# Patient Record
Sex: Female | Born: 1995 | Race: White | Hispanic: No | Marital: Single | Smoking: Never smoker
Health system: Southern US, Community
[De-identification: ages and names within clinical notes are randomized; demographics above are authoritative.]

---
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
## 2017-01-18 NOTE — Assessment & Plan Note (Signed)
Symptoms are most consistent today with a medial meniscal tear based on MSK ultrasound findings as below.  Ultimately injection today was performed as outlined below.  Once she has good improvement in her symptoms she will be able to return to activities as tolerated but I like for her to take the next several days off.  +++++++++++++++++++++++++++++++++++++++++++++++++++++++++++++++++++++++++++++ PROCEDURE NOTE -  ULTRASOUND GUIDEDINJECTION: Left knee para meniscal cyst injection Images were obtained and interpreted by myself, Gaspar BiddingMichael Rigby, DO  Images have been saved and stored to PACS system. Images obtained on: GE S7 Ultrasound machine  ULTRASOUND FINDINGS:  Medial joint line has moderate degree of pericystic changes within the medial meniscus.  There is no overt swelling but there is a large extruded cystic area of the meniscus.  Lateral joint line is normal appearing.  Quadricep tendon is intact.  She has only a small supraphysiologic effusion.  DESCRIPTION OF PROCEDURE:  The patient's clinical condition is marked by substantial pain and/or significant functional disability. Other conservative therapy has not provided relief, is contraindicated, or not appropriate. There is a reasonable likelihood that injection will significantly improve the patient's pain and/or functional impairment. After discussing the risks, benefits and expected outcomes of the injection and all questions were reviewed and answered, the patient wished to undergo the above named procedure. Verbal consent was obtained. The ultrasound was used to identify the target structure and adjacent neurovascular structures. The skin was then prepped in sterile fashion and the target structure was injected under direct visualization using sterile technique as below: PREP: Alcohol, Ethel Chloride APPROACH: Medial,, sterile stopcock Technique, 22g 1.5" needle INJECTATE: 3 cc 1% lidocaine, 2 cc 0.5% marcaine, 2cc 40mg   DepoMedrol ASPIRATE: N/A DRESSING: Band-Aid  Post procedural instructions including recommending icing and warning signs for infection were reviewed. This procedure was well tolerated and there were no complications.   IMPRESSION: Succesful US Guided Injection    Left knee

## 2017-01-21 ENCOUNTER — Encounter: Payer: Self-pay | Admitting: Sports Medicine

## 2017-02-08 ENCOUNTER — Encounter: Payer: Self-pay | Admitting: Sports Medicine

## 2017-04-04 ENCOUNTER — Encounter: Payer: Self-pay | Admitting: Sports Medicine

## 2017-04-14 ENCOUNTER — Ambulatory Visit (INDEPENDENT_AMBULATORY_CARE_PROVIDER_SITE_OTHER): Payer: No Typology Code available for payment source | Admitting: Sports Medicine

## 2017-04-14 ENCOUNTER — Encounter: Payer: Self-pay | Admitting: Sports Medicine

## 2017-04-14 ENCOUNTER — Ambulatory Visit: Payer: Self-pay

## 2017-04-14 VITALS — BP 102/62 | HR 86 | Ht 68.01 in | Wt 129.0 lb

## 2017-04-14 DIAGNOSIS — M79604 Pain in right leg: Secondary | ICD-10-CM | POA: Diagnosis not present

## 2017-04-14 DIAGNOSIS — R5383 Other fatigue: Secondary | ICD-10-CM | POA: Diagnosis not present

## 2017-04-14 DIAGNOSIS — M79605 Pain in left leg: Secondary | ICD-10-CM | POA: Diagnosis not present

## 2017-04-14 LAB — COMPREHENSIVE METABOLIC PANEL
ALT: 14 U/L (ref 0–35)
AST: 22 U/L (ref 0–37)
Albumin: 4.9 g/dL (ref 3.5–5.2)
Alkaline Phosphatase: 78 U/L (ref 47–119)
BILIRUBIN TOTAL: 0.6 mg/dL (ref 0.3–1.2)
BUN: 15 mg/dL (ref 6–23)
CO2: 29 meq/L (ref 19–32)
CREATININE: 0.77 mg/dL (ref 0.40–1.20)
Calcium: 9.9 mg/dL (ref 8.4–10.5)
Chloride: 104 mEq/L (ref 96–112)
GFR: 102.73 mL/min (ref >60.00)
GLUCOSE: 89 mg/dL (ref 70–99)
Potassium: 4.2 mEq/L (ref 3.5–5.1)
Sodium: 139 mEq/L (ref 135–145)
Total Protein: 6.9 g/dL (ref 6.0–8.3)

## 2017-04-14 LAB — CBC WITH DIFFERENTIAL/PLATELET
BASOS PCT: 0.7 % (ref 0.0–3.0)
Basophils Absolute: 0 10*3/uL (ref 0.0–0.1)
Eosinophils Absolute: 0.2 10*3/uL (ref 0.0–0.7)
Eosinophils Relative: 2.7 % (ref 0.0–5.0)
HCT: 41.4 % (ref 36.0–49.0)
HEMOGLOBIN: 13.9 g/dL (ref 12.0–16.0)
Lymphocytes Relative: 37.6 % (ref 24.0–48.0)
Lymphs Abs: 2.2 10*3/uL (ref 0.7–4.0)
MCHC: 33.5 g/dL (ref 31.0–37.0)
MCV: 96.3 fl (ref 78.0–98.0)
MONO ABS: 0.4 10*3/uL (ref 0.1–1.0)
Monocytes Relative: 7.1 % (ref 3.0–12.0)
Neutro Abs: 3 10*3/uL (ref 1.4–7.7)
Neutrophils Relative %: 51.9 % (ref 43.0–71.0)
Platelets: 173 10*3/uL (ref 150.0–575.0)
RBC: 4.3 Mil/uL (ref 3.80–5.70)
RDW: 12 % (ref 11.4–15.5)
WBC: 5.9 10*3/uL (ref 4.5–13.5)

## 2017-04-14 LAB — VITAMIN D 25 HYDROXY (VIT D DEFICIENCY, FRACTURES): VITD: 62.32 ng/mL (ref 30.00–100.00)

## 2017-04-14 LAB — FERRITIN: FERRITIN: 46.2 ng/mL (ref 10.0–291.0)

## 2017-04-14 NOTE — Progress Notes (Signed)
OFFICE VISIT NOTE [name] [name] Sports Medicine [facility] at [facility] [phone]  Maleaha Hughett - 21 y.o. female MRN [id number]  Date of birth: 21-Apr-1996  Visit Date: 04/14/2017  PCP: Patient, No Pcp Per   Referred by: No ref. provider found  Orlie Dakin, CMA acting as scribe for Dr. Berline Chough.  SUBJECTIVE:   Chief Complaint  Patient presents with  . bilateral leg pain   HPI: As below and per problem based documentation when appropriate.  Rolena is an established patient presenting today with complaint of bilateral leg pain. She has been seen for left leg pain in the past. She says that the pain in her left leg is just the same as when she was here before and had the stress reaction. She is now having the same sx in her right leg. The twinges of pain started last semester but have gotten worse. Being on her feet for a while after she runs makes the pain worse. She doesn't have pain while running, especially if she wears the compression band. The pain normally lasts until she sits down. She has iced both legs and taken Ibuprofen with some relief. Pain is rated about 6/10 at its worst but 1/10 when its mild. Pain is described as sharp initially but once it started swelling its an aching sensation. She says that her legs feel swollen but don't look swollen. She denies pain in the groin. She does have some tightness in both hips. She denies knee pain and ankle pain. She has had tightness in both calves x 2 days.      Review of Systems  Constitutional: Negative for chills, fever and malaise/fatigue.  HENT: Negative.   Respiratory: Negative for shortness of breath and wheezing.   Cardiovascular: Positive for leg swelling. Negative for chest pain and palpitations.  Gastrointestinal: Negative.   Genitourinary: Negative.   Musculoskeletal: Positive for myalgias.  Skin: Negative.   Neurological: Negative for dizziness, tingling, weakness and headaches.    Endo/Heme/Allergies: Does not bruise/bleed easily.  Psychiatric/Behavioral: Negative.     Otherwise per HPI.  HISTORY & PERTINENT PRIOR DATA:  No specialty comments available. She reports that she has never smoked. She has never used smokeless tobacco. No results for input(s): HGBA1C, LABURIC in the last 8760 hours. Medications & Allergies reviewed per EMR Patient Active Problem List   Diagnosis Date Noted  . Right leg pain 04/14/2017  . Quadriceps strain, left, initial encounter 12/19/2016  . Left leg pain 12/11/2016   No past medical history on file. Family History  Problem Relation Age of Onset  . Cancer Father    No past surgical history on file. Social History   Occupational History  . Not on file.   Social History Main Topics  . Smoking status: Never Smoker  . Smokeless tobacco: Never Used  . Alcohol use No  . Drug use: Unknown  . Sexual activity: Yes    Birth control/ protection: IUD    OBJECTIVE:  VS:  HT:5' 8.01" (172.7 cm)   WT:129 lb (58.5 kg)  BMI:19.6    BP:102/62  HR:86bpm  TEMP: ( )  RESP:97 % EXAM: Findings:  WDWN, NAD, Non-toxic appearing Alert & appropriately interactive Not depressed or anxious appearing No increased work of breathing. Pupils are equal. EOM intact without nystagmus No clubbing or cyanosis of the extremities appreciated No significant rashes/lesions/ulcerations overlying the examined area. DP & PT pulses 2+/4.  No significant pretibial edema. Sensation intact to light  touch in lower extremities.  Bilateral legs:  Right leg is overall well aligned.  She is very thin but muscular.  She does have pain with fulcrum testing on the right, not on the left but this is mild.  Pain localizes to the distal femur.  Some pain with palpation of the vastus medialis on the right is not present on the left but this is mild.  Good internal/external rotation bilateral hips, hip abduction strength is 5-/5 with T FL predominance.  Her bilateral  feet have a moderate arch with early breakdown of the transverse arch with early bunion formation  Gait analysis on the treadmill does reveal overall neutral gait with forefoot strike with questionable amount of over striding but this is mild.  She does have an anterior pelvic tilt.      [place] Limited Joint Space Structures Low Bilat(no Linked Charges)  Result Date: 04/14/2017 Andrena MewsRigby, Marguetta Windish D, DO     04/16/2017 11:38 PM LIMITED MSK ULTRASOUND OF Right Leg Images were obtained and interpreted by myself, Gaspar BiddingMichael Kemyra August, DO Images have been saved and stored to PACS system. Images obtained on: GE S7 Ultrasound machine FINDINGS: Multiple views longitudinal and transverse planes were obtained of the middle third of the femur as well as surrounding muscle skeletal structures.  There is no obvious abnormality at the femoral cortex although there is a questionable amount of periosteal reaction however this seems symmetric to the other side.  Additionally there is clinically uncertain periosteal vessel that is in the area of discomfort with sono palpation with mild. The quadricep musculature does appear to be slightly  heterogeneous suggesting possible glycogen depletion IMPRESSION: 1. Nondiagnostic MSK ultrasound.  2. Question of potential early stress reaction of the femoral shaft. 3. Question glycogen depletion of the quadricep musculature   ASSESSMENT & PLAN:     ICD-10-CM   1. Bilateral leg pain M79.604 ibuprofen (ADVIL,MOTRIN) 200 MG tablet   M79.605 CBC with Differential/Platelet    Comprehensive metabolic panel  2. Right leg pain M79.604 US LIMITED JOINT SPACE STRUCTURES LOW BILAT(NO LINKED CHARGES)  3. Fatigue, unspecified type R53.83 CBC with Differential/Platelet    Comprehensive metabolic panel    VITAMIN D 25 Hydroxy (Vit-D Deficiency, Fractures)    Iron and TIBC    Ferritin  ================================================================= Right leg pain Patient is recently had a stress  reaction of the left femur with concerns for potential early stress fracture at this time on the right.  Will check for metabolic causes and have her take the next 2 weeks significantly easier she has not had a good rest.  Since resuming training.  I would like to get her fit [facility] and she should not run outside until the cement done.  Okay to cross train as tolerated.  If any pain she knows that she should not continue.  She should follow-up in 2 weeks   >50% of this 25 minute visit spent in direct patient counseling and/or coordination of care.  Discussion was focused on education regarding the in discussing the pathoetiology and anticipated clinical course of the above condition.  Importance of nutrition counseling and appropriate recovery discussed.  I do think there is a component of overuse however she has not been training excessively and I believe we need to look for a biomechanical and nutritional causes. ================================================================= Patient Instructions  I believe you have an early stress reaction of your right leg.  We need to ensure that you are  recovering appropriately and her ensuring that your lab work looks okay today.    Be sure you are getting 30g of carbohydrate and 10g of protein within 30 minutes of your runs; please follow-up with the nutritionist as well.  Decrease your running to no more than 3 miles, 4 days per week for the next week.  As long as you continue to improve we will be able to bump up your mileage at that point.    We will also set you up with a set of cushioned insoles to see if this can help reduce some of the impact that is contributing to your leg pain. Joselyn Glassman will be able to do these for you.   Also check out the YouTube Video from Dr. Myles Lipps.  I would like to see you try performing this 5-6 days per week.   A good intro video is: "Independence from Pain  7-minute Video" - [email or web address]  His more advanced video is: "Powerful Posture and Pain Relief: 12 minutes of Foundation Training" - https://youtu.be/[email or web address]   Do not try to attempt this entire video when first beginning.    Try breaking of each exercise that he goes into shorter segments.  Otherwise if they perform an exercise for 45 seconds, start with 15 seconds and rest and then resume with a begin the new activity.  Work your way up to doing this 12 minute video and I expect to see significant improvements in your pain.  ================================================================= Future Appointments Date Time Provider Department Center  04/28/2017 11:15 AM Andrena Mews, DO [facility] None    Follow-up: Return in about 2 weeks (around 04/28/2017).   CMA/ATC served as Neurosurgeon during this visit. History, Physical, and Plan performed by medical provider. Documentation and orders reviewed and attested to.      Gaspar Bidding, DO    [facility] Sports Medicine Physician

## 2017-04-14 NOTE — Patient Instructions (Signed)
I believe you have an early stress reaction of your right leg.  We need to ensure that you are recovering appropriately and her ensuring that your lab work looks okay today.    Be sure you are getting 30g of carbohydrate and 10g of protein within 30 minutes of your runs; please follow-up with the nutritionist as well.  Decrease your running to no more than 3 miles, 4 days per week for the next week.  As long as you continue to improve we will be able to bump up your mileage at that point.    We will also set you up with a set of cushioned insoles to see if this can help reduce some of the impact that is contributing to your leg pain. Joselyn Glassmanyler will be able to do these for you.   Also check out the YouTube Video from Dr. Myles LippsEric Goodman.  I would like to see you try performing this 5-6 days per week.   A good intro video is: "Independence from Pain 7-minute Video" - [email or web address]/[email or web address]?[email or web address]  His more advanced video is: "Powerful Posture and Pain Relief: 12 minutes of Foundation Training" - https://youtu.be/[email or web address]   Do not try to attempt this entire video when first beginning.    Try breaking of each exercise that he goes into shorter segments.  Otherwise if they perform an exercise for 45 seconds, start with 15 seconds and rest and then resume with a begin the new activity.  Work your way up to doing this 12 minute video and I expect to see significant improvements in your pain.

## 2017-04-15 LAB — IRON AND TIBC
%SAT: 33 % (ref 8–45)
Iron: 113 ug/dL (ref 27–164)
TIBC: 341 ug/dL (ref 271–448)
UIBC: 228 ug/dL

## 2017-04-16 NOTE — Assessment & Plan Note (Addendum)
Patient is recently had a stress reaction of the left femur with concerns for potential early stress fracture at this time on the right.  Will check for metabolic causes and have her take the next 2 weeks significantly easier she has not had a good rest.  Since resuming training.  I would like to get her fit for custom cushion orthotics through the [facility] and she should not run outside until the cement done.  Okay to cross train as tolerated.  If any pain she knows that she should not continue.  She should follow-up in 2 weeks   >50% of this 25 minute visit spent in direct patient counseling and/or coordination of care.  Discussion was focused on education regarding the in discussing the pathoetiology and anticipated clinical course of the above condition.  Importance of nutrition counseling and appropriate recovery discussed.  I do think there is a component of overuse however she has not been training excessively and I believe we need to look for a biomechanical and nutritional causes.

## 2017-04-16 NOTE — Procedures (Signed)
LIMITED MSK ULTRASOUND OF Right Leg Images were obtained and interpreted by myself, Gaspar BiddingMichael Rigby, DO  Images have been saved and stored to PACS system. Images obtained on: GE S7 Ultrasound machine  FINDINGS:  Multiple views longitudinal and transverse planes were obtained of the middle third of the femur as well as surrounding muscle skeletal structures.  There is no obvious abnormality at the femoral cortex although there is a questionable amount of periosteal reaction however this seems symmetric to the other side.  Additionally there is clinically uncertain periosteal vessel that is in the area of discomfort with sono palpation with mild. The quadricep musculature does appear to be slightly  heterogeneous suggesting possible glycogen depletion IMPRESSION:  1. Nondiagnostic MSK ultrasound.   2. Question of potential early stress reaction of the femoral shaft. 3. Question glycogen depletion of the quadricep musculature

## 2017-04-28 ENCOUNTER — Encounter: Payer: Self-pay | Admitting: Sports Medicine

## 2017-04-28 ENCOUNTER — Ambulatory Visit (INDEPENDENT_AMBULATORY_CARE_PROVIDER_SITE_OTHER): Payer: No Typology Code available for payment source | Admitting: Sports Medicine

## 2017-04-28 VITALS — BP 100/62 | HR 78 | Ht 68.0 in | Wt 129.6 lb

## 2017-04-28 DIAGNOSIS — M23004 Cystic meniscus, unspecified medial meniscus, left knee: Secondary | ICD-10-CM | POA: Diagnosis not present

## 2017-04-28 DIAGNOSIS — M25562 Pain in left knee: Secondary | ICD-10-CM | POA: Diagnosis not present

## 2017-04-28 DIAGNOSIS — M79604 Pain in right leg: Secondary | ICD-10-CM

## 2017-04-28 DIAGNOSIS — M79605 Pain in left leg: Secondary | ICD-10-CM

## 2017-04-28 MED ORDER — DICLOFENAC SODIUM 2 % TD SOLN: 1.0000 "application " | Freq: Two times a day (BID) | TRANSDERMAL | 0 refills | Status: AC

## 2017-04-28 MED ORDER — DICLOFENAC SODIUM 2 % TD SOLN: 1.0000 "application " | Freq: Two times a day (BID) | TRANSDERMAL | 2 refills | Status: DC

## 2017-04-28 NOTE — Patient Instructions (Signed)
I am okay with you returning to 20 miles per week at this time.  It is okay to start interspersing some speed work.  Do not increase her mileage above 30 miles over the next 3-4 weeks.  No more than 4-5 days of running per week, crosstraining on the other side.  If you are able to accommodate this in the next 2-3 weeks I am okay with you resuming racing.  Work with Joselyn Glassman using E-stem, cupping, and massage on the quads  Work on one leg at lifting activities in the gym including one legged squats.  We need to work on getting your left quad as strong as the right.  Be sure you follow the nutrition plan as outlined by the nutritionist.  He have to keep your energy stores full to be able to prevent muscle and bone stress.  You can use the Pennsaid on your left knee as needed up to 2 times per day

## 2017-04-28 NOTE — Progress Notes (Signed)
OFFICE VISIT NOTE Allison Burns. Allison Burns Sports Medicine [facility] at [facility] [phone]  Allison Burns - 21 y.o. female MRN [id number]  Date of birth: [birth year]  Visit Date: 04/28/2017  PCP: Patient, No Pcp Per   Referred by: No ref. provider found  Allison Burns, CMA acting as scribe for Dr. Berline Burns.  SUBJECTIVE:   Chief Complaint  Patient presents with  . Follow-up    bilateral quad pain   HPI: As below and per problem based documentation when appropriate.  Pt here for follow up on bilateral quad pain. Pain is better in right leg, but starting to have some pain in left lower quad/knee again.   Pt says pain in left quad/knee is very similar to how it felt back in April. Pain is medial on left knee. Pt had a cortisone injection in April that provided relief. Pain is not constant, but pt describes pain as a dull ache when she has it. No swelling. Pt says she has pain about three days a week. Rates pain as a 1 out of 10.  Pt is running every other day and biking every day.   Pt is not using any ice or heat on knee. Pt does not take NSAIDs often, but does get relief when she takes them.      Review of Systems  Constitutional: Negative for chills and fever.  Respiratory: Negative for shortness of breath.   Cardiovascular: Negative for chest pain.  Musculoskeletal: Positive for joint pain. Negative for falls.  Endo/Heme/Allergies: Does not bruise/bleed easily.    Otherwise per HPI.  HISTORY & PERTINENT PRIOR DATA:  No specialty comments available. She reports that she has never smoked. She has never used smokeless tobacco. No results for input(s): HGBA1C, LABURIC in the last 8760 hours. Medications & Allergies reviewed per EMR Patient Active Problem List   Diagnosis Date Noted  . Cyst of medial meniscus of left knee 04/28/2017  . Right leg pain 04/14/2017  . Quadriceps strain, left, initial encounter 12/19/2016  . Left leg pain 12/11/2016   No  past medical history on file. Family History  Problem Relation Age of Onset  . Cancer Father    No past surgical history on file. Social History   Occupational History  . Not on file.   Social History Main Topics  . Smoking status: Never Smoker  . Smokeless tobacco: Never Used  . Alcohol use No  . Drug use: Unknown  . Sexual activity: Yes    Birth control/ protection: IUD    OBJECTIVE:  VS:  HT:5\' 8"  (172.7 cm)   WT:129 lb 9.6 oz (58.8 kg)  BMI:19.71    BP:100/62  HR:78bpm  TEMP: ( )  RESP:98 % EXAM: Findings:  Adult female.  No acute distress.  Alert and appropriate.  Bilateral lower extremities overall well aligned.  Quite muscular but thin.  She has a small amount of pain with fulcrum testing but no pain with jump testing.  Pain does not localize.     [place] Limited Joint Space Structures Low Bilat(no Linked Charges)  Result Date: 04/14/2017 Allison Mews, DO     04/16/2017 11:38 PM LIMITED MSK ULTRASOUND OF Right Leg Images were obtained and interpreted by myself, Allison Bidding, DO Images have been saved and stored to PACS system. Images obtained on: GE S7 Ultrasound machine FINDINGS: Multiple views longitudinal and transverse planes were obtained of the middle third of the femur as well as surrounding muscle skeletal  structures.  There is no obvious abnormality at the femoral cortex although there is a questionable amount of periosteal reaction however this seems symmetric to the other side.  Additionally there is clinically uncertain periosteal vessel that is in the area of discomfort with sono palpation with mild. The quadricep musculature does appear to be slightly  heterogeneous suggesting possible glycogen depletion IMPRESSION: 1. Nondiagnostic MSK ultrasound.  2. Question of potential early stress reaction of the femoral shaft. 3. Question glycogen depletion of the quadricep musculature   ASSESSMENT & PLAN:     ICD-10-CM   1. Left knee pain, unspecified chronicity  M25.562   2. Left leg pain M79.605   3. Bilateral leg pain M79.604    M79.605   4. Cyst of medial meniscus of left knee M23.004   ================================================================= Left leg pain See AVS for further discussion on plan.>50% of this 25 minute visit spent in direct patient counseling and/or coordination of care.  Discussion was focused on education regarding the in discussing the pathoetiology and anticipated clinical course of the above condition.  Ultimately slow return to running is discussed and I actually recommend that she avoid running more than 5 days per week.  Okay to cross train on the other days.  From a symptom standpoint as long as she is not having any worsening symptoms I am okay with her working towards running upwards to 35-40 miles per week but cautioned her in doing so.  I do think that some of the inability to train at this level is secondary to her nutrition intake and she will meet with the nutritionist at [facility].  She also should be wearing the custom cushion insoles in any of her shoes other than spikes.  If any lack of improvement will plan for further rest but no indication for further advanced imaging at this time.  Cyst of medial meniscus of left knee Small amount of discomfort returning of the medial meniscus but suspect this is likely secondary to under recovery/undernutrition.  We will have her use Pennsaid and if any lack of improvement can consider further injection but would like to avoid this. ================================================================= Patient Instructions  I am okay with you returning to 20 miles per week at this time.  It is okay to start interspersing some speed work.  Do not increase her mileage above 30 miles over the next 3-4 weeks.  No more than 4-5 days of running per week, crosstraining on the other side.  If you are able to accommodate this in the next 2-3 weeks I am okay with you resuming racing.  Work with  Allison Burns using E-stem, cupping, and massage on the quads  Work on one leg at lifting activities in the gym including one legged squats.  We need to work on getting your left quad as strong as the right.  Be sure you follow the nutrition plan as outlined by the nutritionist.  He have to keep your energy stores full to be able to prevent muscle and bone stress.  You can use the Pennsaid on your left knee as needed up to 2 times per day ================================================================= No future appointments.  Follow-up: Return if symptoms worsen or fail to improve, for We will plan to check again in the athletic training room in several weeks.   CMA/ATC served as Neurosurgeon during this visit. History, Physical, and Plan performed by medical provider. Documentation and orders reviewed and attested to.      Allison Bidding, DO    [facility]  Sports Medicine Physician

## 2017-05-05 NOTE — Assessment & Plan Note (Signed)
Small amount of discomfort returning of the medial meniscus but suspect this is likely secondary to under recovery/undernutrition.  We will have her use Pennsaid and if any lack of improvement can consider further injection but would like to avoid this.

## 2017-05-05 NOTE — Assessment & Plan Note (Signed)
See AVS for further discussion on plan.>50% of this 25 minute visit spent in direct patient counseling and/or coordination of care.  Discussion was focused on education regarding the in discussing the pathoetiology and anticipated clinical course of the above condition.  Ultimately slow return to running is discussed and I actually recommend that she avoid running more than 5 days per week.  Okay to cross train on the other days.  From a symptom standpoint as long as she is not having any worsening symptoms I am okay with her working towards running upwards to 35-40 miles per week but cautioned her in doing so.  I do think that some of the inability to train at this level is secondary to her nutrition intake and [facility].  She also should be wearing the custom cushion insoles in any of her shoes other than spikes.  If any lack of improvement will plan for further rest but no indication for further advanced imaging at this time.

## 2017-05-28 ENCOUNTER — Encounter: Payer: Self-pay | Admitting: Sports Medicine

## 2017-05-28 DIAGNOSIS — M23004 Cystic meniscus, unspecified medial meniscus, left knee: Secondary | ICD-10-CM

## 2017-05-28 DIAGNOSIS — M79604 Pain in right leg: Secondary | ICD-10-CM

## 2017-05-28 DIAGNOSIS — E639 Nutritional deficiency, unspecified: Secondary | ICD-10-CM

## 2017-05-28 NOTE — Assessment & Plan Note (Signed)
She has done well working with the nutritionist and has improved her eating habits.  She is recovering adequately.  Emphasized importance of relative rest on a regular basis for appropriate recovery.  If any early recurrence of symptoms will plan to follow-up with her otherwise as needed.

## 2017-05-28 NOTE — Progress Notes (Signed)
  [facility] Training Room Note Allison Burns. Allison Burns Sports Medicine [facility] at [facility] [phone]  Allison Burns - 21 y.o. female MRN [id number]  Date of birth: 03/06/1996  Visit Date: 05/28/2017  PCP: Patient, No Pcp Per   Referred by: No ref. provider found  SUBJECTIVE:  CC: F/u knee and leg pain  HPI: Feeling well.  No pain reported at this time.  No limitations she has returned to 6 day/week workouts with other than a slow return.  She is now taking anti-inflammatories doing well.  Custom orthotics are comfortable.  ROS:  Otherwise per HPI.  OBJECTIVE:   Walk with a normal gait.  Overall bilateral lower extremities overall well aligned.  IMAGING & PROCEDURES: n/a   ASSESSMENT & PLAN:  Visit Diagnoses:  1. Cyst of medial meniscus of left knee   2. Right leg pain   3. Undernutrition    Undernutrition She has done well working with the nutritionist and has improved her eating habits.  She is recovering adequately.  Emphasized importance of relative rest on a regular basis for appropriate recovery.  If any early recurrence of symptoms will plan to follow-up with her otherwise as needed.         Allison Burns Sports Medicine Physician    05/28/2017 4:49 PM

## 2017-07-03 ENCOUNTER — Ambulatory Visit (INDEPENDENT_AMBULATORY_CARE_PROVIDER_SITE_OTHER): Payer: No Typology Code available for payment source | Admitting: Sports Medicine

## 2017-07-03 ENCOUNTER — Telehealth: Payer: Self-pay | Admitting: Sports Medicine

## 2017-07-03 ENCOUNTER — Other Ambulatory Visit: Payer: Self-pay

## 2017-07-03 ENCOUNTER — Encounter: Payer: Self-pay | Admitting: Sports Medicine

## 2017-07-03 ENCOUNTER — Ambulatory Visit (INDEPENDENT_AMBULATORY_CARE_PROVIDER_SITE_OTHER): Payer: No Typology Code available for payment source

## 2017-07-03 VITALS — BP 100/64 | HR 65 | Ht 68.01 in | Wt 131.8 lb

## 2017-07-03 DIAGNOSIS — M79661 Pain in right lower leg: Secondary | ICD-10-CM

## 2017-07-03 DIAGNOSIS — E639 Nutritional deficiency, unspecified: Secondary | ICD-10-CM

## 2017-07-03 MED ORDER — IBUPROFEN-FAMOTIDINE 800-26.6 MG PO TABS: 1.0000 | ORAL_TABLET | Freq: Three times a day (TID) | ORAL | 0 refills | Status: DC | PRN

## 2017-07-03 MED ORDER — IBUPROFEN-FAMOTIDINE 800-26.6 MG PO TABS: ORAL_TABLET | ORAL | 2 refills | Status: DC

## 2017-07-03 MED ORDER — IBUPROFEN-FAMOTIDINE 800-26.6 MG PO TABS: 1.0000 | ORAL_TABLET | Freq: Three times a day (TID) | ORAL | 2 refills | Status: DC | PRN

## 2017-07-03 NOTE — Telephone Encounter (Signed)
[facility] rx [facility] to [facility]

## 2017-07-03 NOTE — Telephone Encounter (Signed)
Please call back to clarify directions on ibuprofen sent today.  Ty,  -LL

## 2017-07-03 NOTE — Progress Notes (Signed)
OFFICE VISIT NOTE Allison FellsMichael D. Delorise Shinerigby, DO  [facility] [phone]  Kathrene AluMadison Burns - 21 y.o. female MRN [id number]  Date of birth: Aug 08, 1996  Visit Date: 07/03/2017  PCP: Patient, No Pcp Per   Referred by: No ref. provider found  Stevenson ClinchBrandy Coleman, CMA acting as scribe for Dr. Berline Choughigby.  SUBJECTIVE:   Chief Complaint  Patient presents with  . Acute Visit    RT shin pain   HPI: As below and per problem based documentation when appropriate.  Allison Burns is an established patient presenting today for evaluation of RT shin pain.  Pain has been present x 2-3 weeks.  Pt thinks that the cause of her pain is from running in the wrong shoes.   The pain is described as sharp and swollen. The shin is tender to palpation. Pain is rated as 7/10 with activity.  Worsened after running. Pain also seems to be worse when first getting up in the morning.  Improves with rest. Therapies tried include : She has tried Pennsaid with no relief. She has tried using ice and heat with some relief. She has also tried cupping which seemed to make the pain worse.   Other associated symptoms include: Pt. Denies radiation of pain.     Review of Systems  Constitutional: Negative for chills, fever and malaise/fatigue.  Respiratory: Negative for shortness of breath and wheezing.   Cardiovascular: Negative for chest pain and palpitations.  Neurological: Negative for dizziness, tingling, weakness and headaches.    Otherwise per HPI.  HISTORY & PERTINENT PRIOR DATA:  No specialty comments available. She reports that  has never smoked. she has never used smokeless tobacco. No results for input(s): HGBA1C, LABURIC, CREATINE in the last 8760 hours.  Invalid input(s): CR Allergies reviewed per EMR Prior to Admission medications   Medication Sig Start Date End Date Taking? Authorizing Provider  Diclofenac Sodium (PENNSAID) 2 % SOLN Place 1 application onto the skin 2  (two) times daily. 04/28/17  Yes Andrena Mewsigby, Vannie Hilgert D, DO  ibuprofen (ADVIL,MOTRIN) 200 MG tablet Take 200 mg by mouth every 6 (six) hours as needed.   Yes [provider]  levonorgestrel (MIRENA) 20 MCG/24HR IUD 1 each by Intrauterine route once.   Yes [provider]  Ibuprofen-Famotidine (DUEXIS) 800-26.6 MG TABS Take 1 tablet by mouth 3 (three) times daily as needed. 07/03/17   Andrena Mewsigby, Blake Goya D, DO  Ibuprofen-Famotidine (DUEXIS) 800-26.6 MG TABS 1 tab po tid X 14 days then 1 tab po tid as needed 07/03/17   Andrena Mewsigby, Alizia Greif D, DO   Patient Active Problem List   Diagnosis Date Noted  . Pain in right shin 07/07/2017  . Undernutrition 05/28/2017  . Cyst of medial meniscus of left knee 04/28/2017  . Right leg pain 04/14/2017  . Quadriceps strain, left, initial encounter 12/19/2016  . Left leg pain 12/11/2016   History reviewed. No pertinent past medical history. Family History  Problem Relation Age of Onset  . Cancer Father    History reviewed. No pertinent surgical history. Social History   Occupational History  . Not on file  Tobacco Use  . Smoking status: Never Smoker  . Smokeless tobacco: Never Used  Substance and Sexual Activity  . Alcohol use: No  . Drug use: Not on file  . Sexual activity: Yes    Birth control/protection: IUD    OBJECTIVE:  VS:  HT:5' 8.01" (172.7 cm)   WT:131 lb 12.8 oz (59.8 kg)  BMI:20.04  BP:100/64  HR:65bpm  TEMP: ( )  RESP:98 % EXAM: Findings:  Legs are overall well aligned.  No significant bruising or ecchymosis.  She has generalized tenderness along the posterior tibialis tendon as well as muscle belly.  She has no significant or focal bony tenderness.  No pain with percussion testing.  She does have difficulty with hop test once again localizing to the posterior aspect.  DP PT pulses 2+/4.    RADIOLOGY: DG Tibia/Fibula Right CLINICAL DATA:  21 year old female with acute right shin pain while running.  EXAM: RIGHT TIBIA  AND FIBULA - 2 VIEW  COMPARISON:  None.  FINDINGS: There is no evidence of fracture or other focal bone lesions. Soft tissues are unremarkable.  IMPRESSION: Negative.  Electronically Signed   By: Sande Brothers M.D.   On: 07/03/2017 09:46  ASSESSMENT & PLAN:     ICD-10-CM   1. Pain in right shin M79.661 DG Tibia/Fibula Right    Ibuprofen-Famotidine (DUEXIS) 800-26.6 MG TABS  2. Undernutrition E63.9    ================================================================= Undernutrition Emphasized the importance of recovery.  Pain in right shin Symptoms are consistent with posterior tibialis tendinitis.  Low likelihood of stress fracture given duration of 3 weeks and normal x-rays.  Likely coming from change in shoe wear.  I would like for her to continue with modified activities with only running 5 days/week as well as with staying in the same shoes that she has been wearing for the past several years which have worked fairly well.  Anytime she seems to change shoes he does have a flareup of her symptoms.  She should continue with the [facility] Orthotics have been fabricated as he seemed to be molded appropriately.  The lack of improvement can consider further diagnostic evaluation at this time she will follow-up as needed.  Anti-inflammatories times 10 days and as needed.  No notes on file ================================================================= There are no Patient Instructions on file for this visit. ================================================================= No future appointments.  Follow-up: Return in about 2 weeks (around 07/17/2017), or if symptoms worsen or fail to improve.   CMA/ATC served as Neurosurgeon during this visit. History, Physical, and Plan performed by medical provider. Documentation and orders reviewed and attested to.      Gaspar Bidding, DO    [facility] Phys[facility]

## 2017-07-07 ENCOUNTER — Encounter: Payer: Self-pay | Admitting: Sports Medicine

## 2017-07-07 DIAGNOSIS — M79661 Pain in right lower leg: Secondary | ICD-10-CM | POA: Insufficient documentation

## 2017-07-07 NOTE — Assessment & Plan Note (Signed)
Symptoms are consistent with posterior tibialis tendinitis.  Low likelihood of stress fracture given duration of 3 weeks and normal x-rays.  Likely coming from change in shoe wear.  I would like for her to continue with modified activities with only running 5 days/week as well as with staying in the same shoes that she has been wearing for the past several years which have worked fairly well.  Anytime she seems to change shoes he does have a flareup of her symptoms.  She should continue with the [facility] Orthotics have been fabricated as he seemed to be molded appropriately.  The lack of improvement can consider further diagnostic evaluation at this time she will follow-up as needed.  Anti-inflammatories times 10 days and as needed.

## 2017-07-07 NOTE — Assessment & Plan Note (Signed)
Emphasized the importance of recovery.

## 2017-07-09 ENCOUNTER — Telehealth: Payer: Self-pay | Admitting: Sports Medicine

## 2017-07-09 NOTE — Telephone Encounter (Signed)
[facility] called in reference to Ibuprofen-Famotidine (DUEXIS) 800-26.6 MG TABS. [facility] would like to knoe what this was prescribed for and if anything else was tried previously. Please advise.

## 2017-07-10 NOTE — Telephone Encounter (Signed)
Returned call and answered pre-auth questions r.e. Duexis prescription.

## 2017-10-14 ENCOUNTER — Telehealth: Payer: Self-pay | Admitting: Sports Medicine

## 2017-10-14 ENCOUNTER — Encounter: Payer: Self-pay | Admitting: Sports Medicine

## 2017-10-14 NOTE — Telephone Encounter (Signed)
Copied from CRM [phone]. Topic: Quick Communication - See Telephone Encounter >> Oct 14, 2017  8:50 AM Guinevere FerrariMorris, Sharamare E, NT wrote: CRM for notification. See Telephone encounter for: Patient called and said she is going the [employer] and wanted to see if the doctor could write her a letter stating that her femur is healed. Pt is going to bring a copy of the format of how letter should be written. Pt would like a call back when letter is written.   10/14/17.

## 2017-10-19 NOTE — Telephone Encounter (Signed)
Relation to pt: self  Call back number: [phone] (M)   Reason for call:  Patient checking the status of my chart message and states she's sorry for the last minute notice but she was just informed by the [employer] that letter is due on Wednesday morning. Please reference MyChart "letter" example regarding what the letter should entail. Please upload letter to MyChart or patient can pick up letter, please advise

## 2017-10-20 ENCOUNTER — Encounter: Payer: Self-pay | Admitting: Physical Therapy

## 2017-10-20 NOTE — Telephone Encounter (Signed)
CRM for notification. 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

## 2017-10-20 NOTE — Telephone Encounter (Signed)
Replied back with a Mychart message.  Awaiting further instruction from the pt.

## 2017-10-21 ENCOUNTER — Encounter: Payer: Self-pay | Admitting: Physical Therapy

## 2017-10-21 NOTE — Telephone Encounter (Signed)
Pt responded and asked for her letter to be changed and to remove the comment about needing to wear orthotics as a preventative measure for any future LE injuries.  Letter has been changed and re-sent via MyChart.

## 2017-12-07 ENCOUNTER — Telehealth: Payer: Self-pay

## 2017-12-07 NOTE — Telephone Encounter (Signed)
Called patient and informed her we don't upload office notes to MyChart. She would have to sign a medical  Release form or come pick up notes at the office. She said she would pick them up at the office.

## 2017-12-07 NOTE — Telephone Encounter (Signed)
Copied from CRM [phone]. Topic: Inquiry >> Dec 07, 2017  1:18 PM Windy KalataMichael, Taylor L, NT wrote: Reason for CRM: patient is calling and states that she is going into the [employer] and the medical staff is requesting the office notes from 12/11/16. She states she would like them sent via mychart and she can print them out and give to them. Please advise.

## 2018-01-14 IMAGING — MR MR FEMUR*L* W/O CM
3 of 8 series · 8 of 40 positions shown · non-contrast
Comparison: None.

CLINICAL DATA: Status post fall 1 year ago. Pain in the thenar.
Severe pain starting 6 months ago.

EXAM:
MR OF THE LEFT FEMUR WITHOUT CONTRAST
TECHNIQUE: Multiplanar, multisequence MR imaging of the left femur was
performed. No intravenous contrast was administered.

[Series 5: T2 fat-sat · axial · left · 6.0mm · 0.45mm/px · z∈[-153,+128]mm · 3 of 45 slices shown (1 of 3)]
[im 9/45]
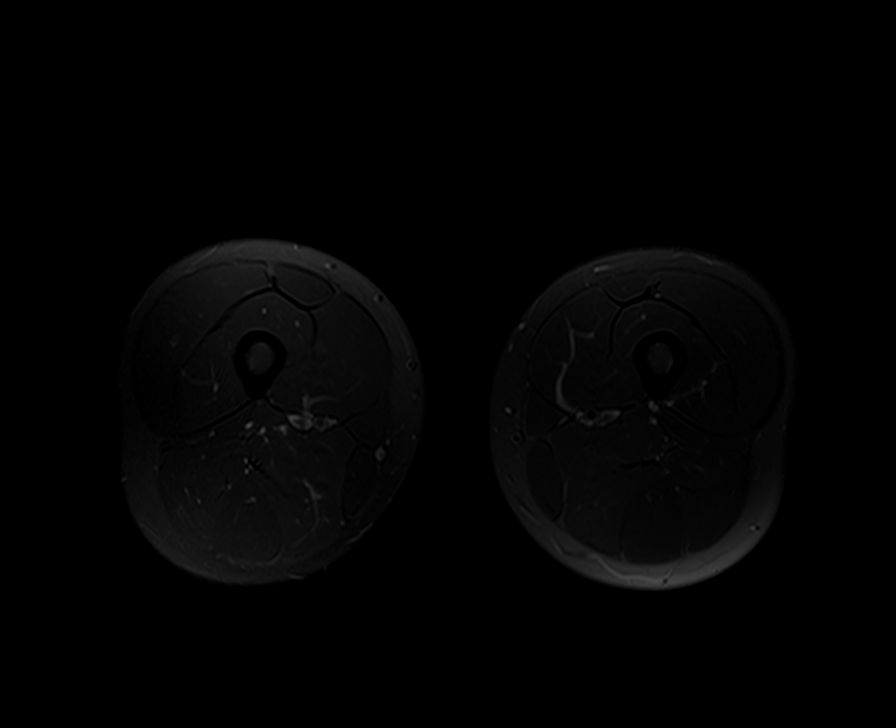
[im 27/45]
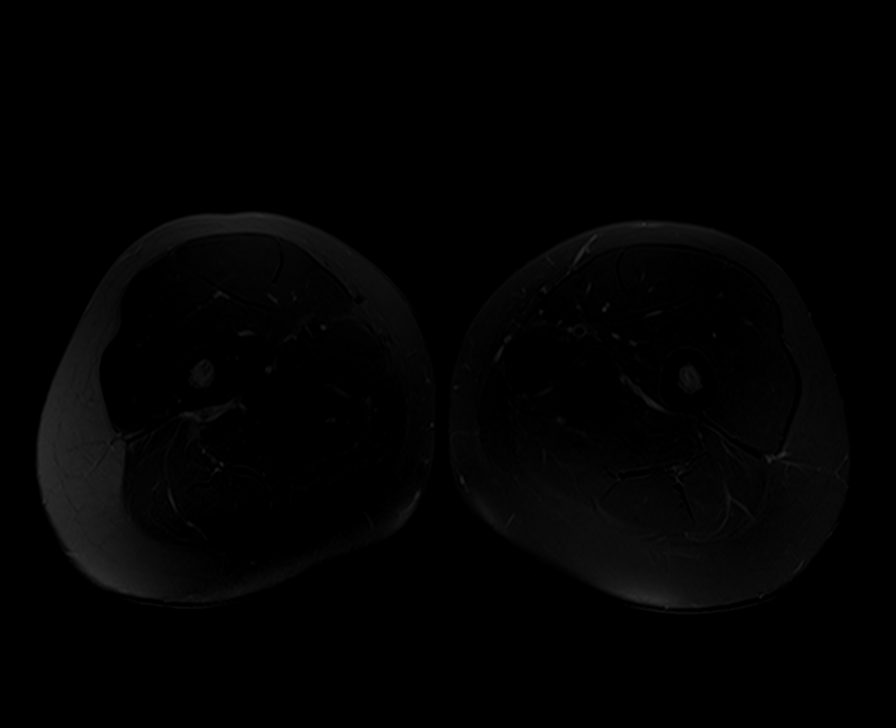
[im 45/45]
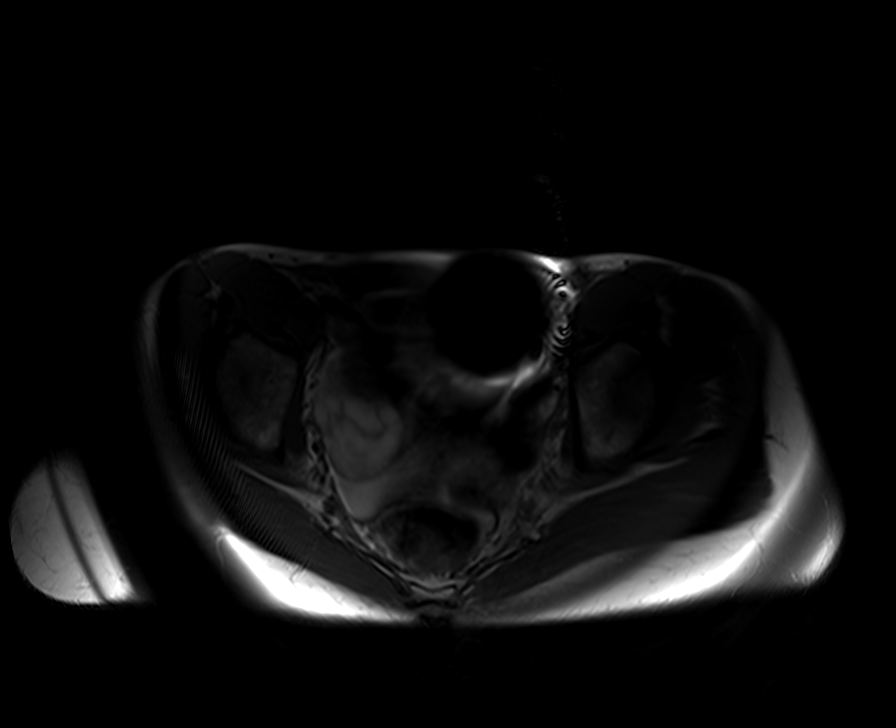

[Series 7: T2 fat-sat · coronal · left · 3.0mm · 0.52mm/px · 3 of 42 slices shown (2 of 3)]
[im 9/42]
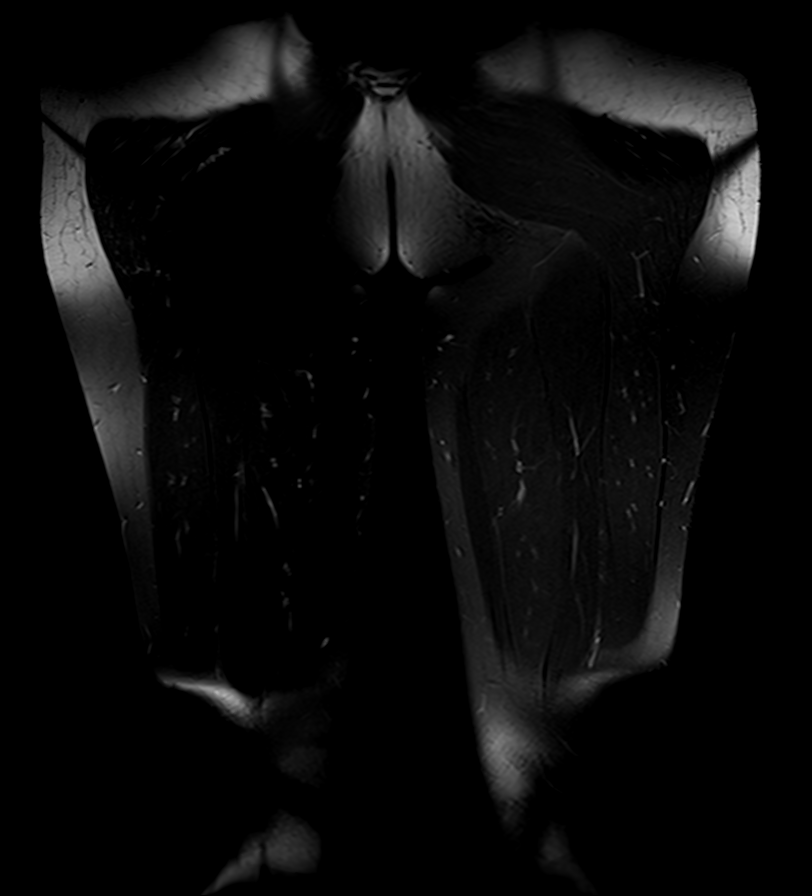
[im 25/42]
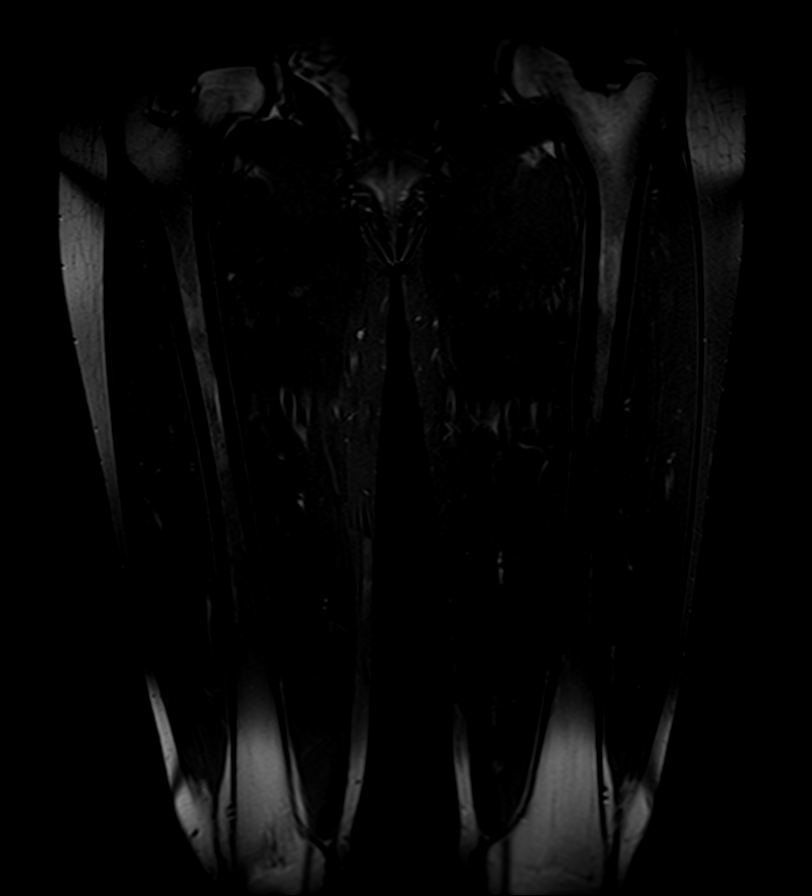
[im 42/42]
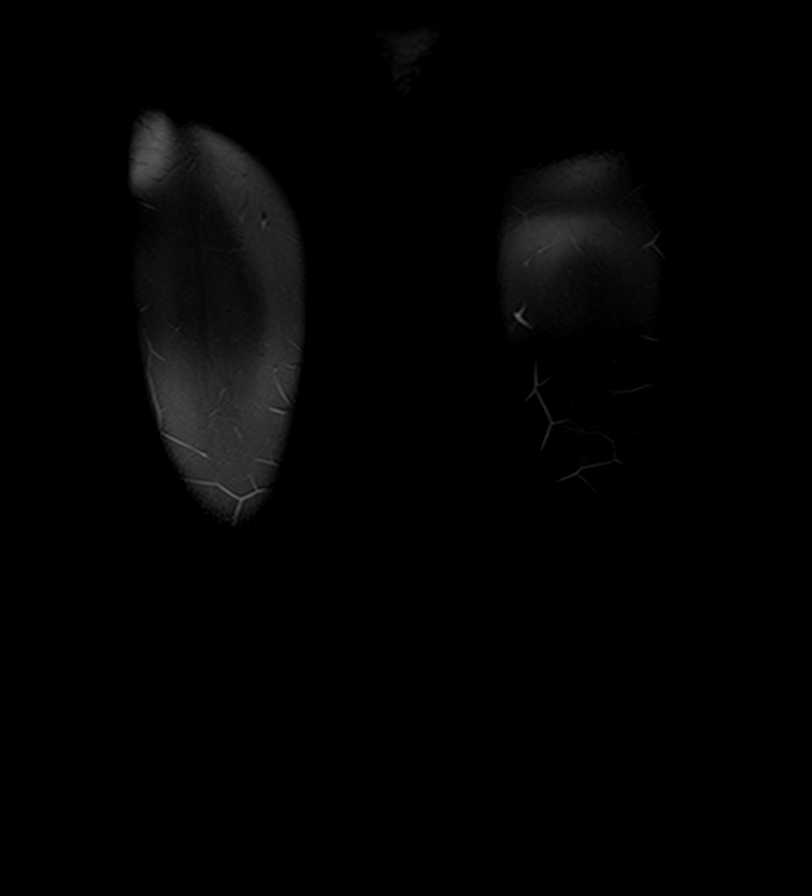

[Series 10: T2 fat-sat · axial · left · 6.0mm · 0.45mm/px · z∈[-332,-184]mm · 2 of 30 slices shown (3 of 3)]
[im 1/30]
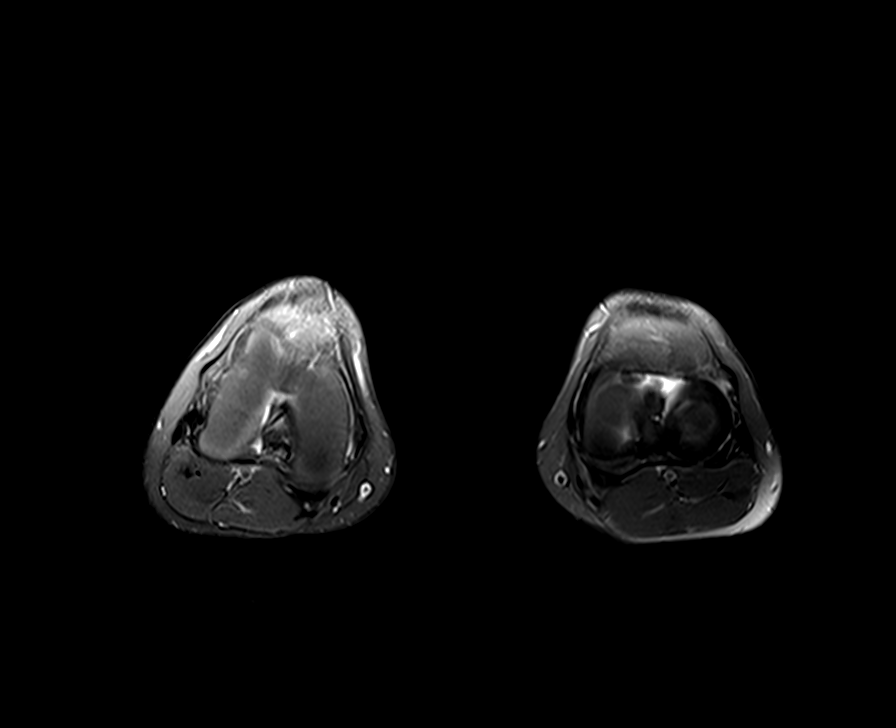
[im 20/30]
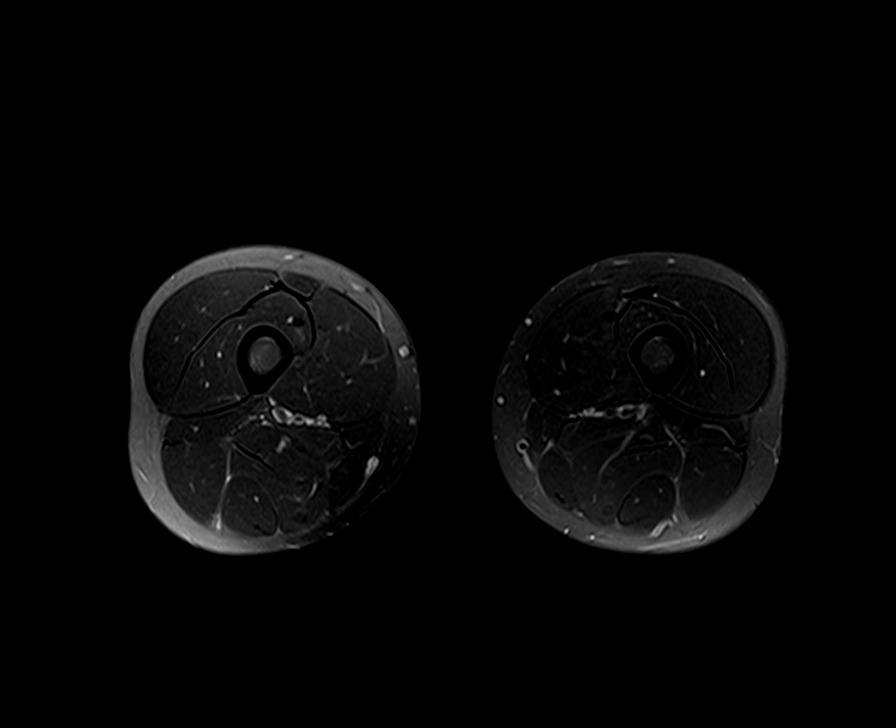

[8 of 40 positions shown; findings below may reference images not displayed]

FINDINGS: Bones/Joint/Cartilage

Mild marrow edema in the proximal left femoral diaphysis with mild
increased signal within the cortex most consistent with stress
reaction without a linear component to suggest a stress fracture.
Mild periostitis.

No other marrow signal abnormality. No fracture or dislocation.
Normal alignment. No joint effusion.

Ligaments, Muscles and Tendons
Muscles are normal. No intramuscular fluid collection or hematoma.
No muscle edema. No muscle atrophy.

Soft tissue
No fluid collection or hematoma.  No soft tissue mass.
IMPRESSION: 1. Mild marrow edema in the proximal left femoral diaphysis and
cortex consistent with stress reaction without a stress fracture.

## 2018-08-16 ENCOUNTER — Ambulatory Visit (INDEPENDENT_AMBULATORY_CARE_PROVIDER_SITE_OTHER): Payer: 59 | Admitting: Sports Medicine

## 2018-08-16 ENCOUNTER — Encounter

## 2018-08-16 VITALS — BP 112/64 | Ht 68.0 in | Wt 135.0 lb

## 2018-08-16 DIAGNOSIS — M7632 Iliotibial band syndrome, left leg: Secondary | ICD-10-CM | POA: Diagnosis not present

## 2018-08-16 NOTE — Patient Instructions (Signed)
It was a pleasure to see you today!  Allison Burns was seen for left knee pain. Come back to the clinic in 4 to 6 weeks.  After exam it appears that you have a condition called ITB syndrome.  For your pain we recommend that you change from ibuprofen to Aleve which you can take twice per day.  We will also be giving you some strengthening exercises for your hip which should help out with this.  You may return to running slowly as your pain tolerates, please only start with 3 days a week, then progressed every other day, then 2 days in a row with a day of rest.    You will need to stop if you find that you are limping.  You did ask about a cortisone shot, which we do not think is necessary at this time.  If you are still having pain as your race approaches we can have another discussion about that.  It also appears that there is a tracking abnormality with your left patella.  This can probably be resolved by strengthening your VMO muscle, so we are going to give you some exercises to do for that as well.    Please bring all your medications to every doctors visit   Sign up for My Chart to have easy access to your labs results, and communication with your Primary care physician.     Please check-out at the front desk before leaving the clinic.     Best,  Dr. Marthenia RollingScott Raiden Haydu FAMILY MEDICINE RESIDENT - PGY2 08/16/2018 3:20 PM

## 2018-08-16 NOTE — Assessment & Plan Note (Signed)
Patient with ITB pain for approximately 1 month.  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  She did request cortisone shot we have asked her to hold off and we can rediscuss that issue as a race approaches if she still needs it.

## 2018-08-16 NOTE — Progress Notes (Signed)
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  Who started having trouble with her left knee lateral pain which started suddenly while she was on 1 of her first runs back for the season about a month ago.  She states that she stopped at a stoplight and when she started going again she had sudden pain.  She does not think that she twisted her knee and did not have any specific injury claimed.  She had trouble walking for a day and since then has had pain with running.  Usually has trouble walking for roughly 1 day after running.  But if she walks for multiple days consistently she has no pain.  She denies any ankle or hip pain at this time.  She denies swelling bruising bleeding or fever symptoms.  We did discuss prior diagnosis of nutritional inadequacy, she states that she is just been busy with work school and track.  She denies any intentional malnutrition and states she has been intentionally trying to gain weight lately because of her hopes of going to the [employer].  She denies body issue images.  CC: Left knee pain  Traumatic: No (Yes? MOI)  Location: Anterolateral left knee along tibia Quality: Sharp (type of pain)  Duration: For approximately 1 month whenever she runs and for 24 hours after Timing: With prolonged exercise (nighttime/exercise/prolonged sitting)  Improving/Worsening: Stable Makes better: Rest Makes worse: Running Associated symptoms: Sharp pain at anterolateral knee  Previous Interventions Tried: Is went to physical therapy and was given exercises, ibuprofen, ice, reduction running  Past Injuries: Multiple femoral stress fractures Past Surgeries: None claimed Smoking: None Family Hx: Not discussed  ROS: Per HPI; in addition no fever, no rash, no additional weakness, no additional numbness, no additional paresthesias, and no additional falls/injury.   Objective: BP 112/64   Ht 5\' 8"  (1.727 m)   Wt 135 lb (61.2 kg)   BMI 20.53 kg/m    Gen:  NAD, well groomed, a/o x3, normal affect, athletic CV: Well-perfused. Warm.  Resp: Non-labored.  Neuro: Sensation intact throughout. No gross coordination deficits.  Gait: Nonpathologic posture, unremarkable stride without signs of limp or balance issues. MSK exam: Knee with no visible deformity, does have J pattern of patella tracking at end of extension.  No wounding, bruising.  No pain to palpation and no fluid accumulation noted.  There is crepitus to patella on flexion extension.  Range of motion intact bilaterally.  Flexion extension strength at the knee is intact symmetrically.  She does have reduced abduction strength to left hip.  No valgus or varus laxity of left knee, no anterior posterior drawer findings.  Negative Thessaly.  Ober's tests indicates ITB tightness.  No distal sensation or motor control deficits.  Assessment and Plan:  Iliotibial band syndrome of left side Patient with ITB pain for approximately 1 month.  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  She did request cortisone shot we have asked her to hold off and we can rediscuss that issue as a race approaches if she still needs it.   No orders of the defined types were placed in this encounter.   No orders of  the defined types were placed in this encounter.    Marthenia Rolling, DO PGY2 Resident 08/16/2018 3:27 PM  Patient seen and evaluated with the resident.  I agree with the above plan of care.  Patient's symptoms are consistent with distal IT band syndrome.  I think her military boots are contributing and I have asked her to stop wearing them for the time being.  She will proceed  with treatment as above including hip abductor strengthening and VMO strengthening.  Gait analysis shows excellent running form.  She is a forefoot striker landing in neutral.  We did briefly discuss the possibility of cortisone injection prior to her race in January if symptoms persist.  Follow-up as needed.

## 2018-08-17 ENCOUNTER — Encounter: Payer: Self-pay | Admitting: Sports Medicine

## 2018-09-13 ENCOUNTER — Encounter: Payer: Self-pay | Admitting: Sports Medicine

## 2018-09-13 ENCOUNTER — Ambulatory Visit (INDEPENDENT_AMBULATORY_CARE_PROVIDER_SITE_OTHER): Payer: 59 | Admitting: Sports Medicine

## 2018-09-13 ENCOUNTER — Ambulatory Visit
Admission: RE | Admit: 2018-09-13 | Discharge: 2018-09-13 | Disposition: A | Discharge status: Home / Self Care | Payer: 59 | Source: Ambulatory Visit | Attending: Sports Medicine | Admitting: Sports Medicine

## 2018-09-13 VITALS — BP 112/68 | Ht 68.0 in | Wt 143.0 lb

## 2018-09-13 DIAGNOSIS — M25562 Pain in left knee: Secondary | ICD-10-CM | POA: Diagnosis not present

## 2018-09-13 DIAGNOSIS — M7632 Iliotibial band syndrome, left leg: Secondary | ICD-10-CM

## 2018-09-14 NOTE — Progress Notes (Signed)
   Subjective:    Patient ID: Allison Burns, female    DOB: 01/17/1996, 23 y.o.   MRN: [id number]  HPI   Alera comes in today for follow-up on lateral left knee pain.  Pain has not improved.  In fact it seems to be worsening.  In addition to lateral knee pain she is now endorsing some locking of the knee when running.  Also some intermittent swelling.  She has been diligent about doing her IT band stretches and hip abductor exercises but her symptoms persist.  Review of her chart shows that she was diagnosed with a para meniscal cyst via ultrasound in April 2018 by Dr. Berline Chough.  She received a single cortisone injection (intra-articular) at that time which alleviated her symptoms.  She is asking about the possibility of a repeat injection today.    Review of Systems As above    Objective:   Physical Exam  Well-developed, well-nourished.  No acute distress.  Awake alert and oriented x3.  Vital signs reviewed  Left knee: Full range of motion.  No obvious effusion.  She is tender to palpation along the lateral joint line with an equivocal McMurray's.  Negative Thessaly's.  No tenderness to palpation along the lateral femoral condyle.  No tenderness along the medial joint line.  Knee remains stable to ligamentous exam.  Hip abductor strength is much improved.  X-rays of the left knee are unremarkable      Assessment & Plan:   Left knee pain worrisome for lateral meniscal tear versus symptomatic discoid meniscus  Patient's symptoms have worsened despite treatment for distal IT band.  She has much better hip abductor strength but still struggling with significant lateral left knee pain.  I am not convinced that this is IT band.  I am more concerned about lateral meniscal pathology.  I recommend an MRI for further evaluation.  I have also explained to her that repeat cortisone injections into a healthy joint may be detrimental.  I will follow-up with her via telephone with MRI results when  available and we will delineate a more definitive treatment plan based on those findings.

## 2018-09-19 ENCOUNTER — Ambulatory Visit
Admission: RE | Admit: 2018-09-19 | Discharge: 2018-09-19 | Disposition: A | Discharge status: Home / Self Care | Payer: 59 | Source: Ambulatory Visit | Attending: Sports Medicine | Admitting: Sports Medicine

## 2018-09-19 DIAGNOSIS — M25562 Pain in left knee: Secondary | ICD-10-CM

## 2018-09-21 ENCOUNTER — Telehealth: Payer: Self-pay | Admitting: Sports Medicine

## 2018-09-21 NOTE — Telephone Encounter (Signed)
  I spoke with Allison Burns on the phone yesterday after reviewing MRI findings of her left knee.  Findings are consistent with distal iliotibial band syndrome.  No evidence of meniscal pathology.  Although her symptoms are a little atypical, I do believe these MRI findings explain her pain.  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  She would like to proceed with a cortisone injection.  I will arrange for her to see Dr. Pearletha Forge in his clinic for injection later this week as I will be out of the office the remainder of the week and the patient would like to have this done as soon as possible.

## 2018-09-21 NOTE — Patient Instructions (Signed)
You were given an injection into your distal IT band today. Ice the area 15 minutes at a time 3-4 times a day as needed - be careful around the nerve below the knee as we discussed though. Tylenol, ibuprofen if needed for pain. Continue home exercises and stretches - hold stretch for 20-30 seconds, repeat 3 times.  Exercises do 3 sets of 10 once a day. Keep working with Market researcher as well. Follow up with Allison Burns in 6 weeks.

## 2018-09-22 ENCOUNTER — Encounter: Payer: Self-pay | Admitting: Family Medicine

## 2018-09-22 ENCOUNTER — Ambulatory Visit (INDEPENDENT_AMBULATORY_CARE_PROVIDER_SITE_OTHER): Payer: 59 | Admitting: Family Medicine

## 2018-09-22 VITALS — BP 100/66 | HR 61 | Ht 68.0 in | Wt 143.0 lb

## 2018-09-22 DIAGNOSIS — M25562 Pain in left knee: Secondary | ICD-10-CM

## 2018-09-22 MED ORDER — METHYLPREDNISOLONE ACETATE 40 MG/ML IJ SUSP
40.0000 mg | Freq: Once | INTRAMUSCULAR | Status: AC
  Administered 2018-09-22: 40 mg

## 2018-09-22 NOTE — Progress Notes (Signed)
PCP: Patient, No Pcp Per  Subjective:   HPI: Patient is a 23 y.o. female here for left IT band injection.  Patient returns for distal IT band injection after MRI results reviewed with Dr. Margaretha Sheffield. She's taken past week off from running. Doing exercises/stretches and working with trainers.  History reviewed. No pertinent past medical history.  Current Outpatient Medications on File Prior to Visit  Medication Sig Dispense Refill  . levonorgestrel (MIRENA) 20 MCG/24HR IUD 1 each by Intrauterine route once.     No current facility-administered medications on file prior to visit.     History reviewed. No pertinent surgical history.  No Known Allergies  Social History   Socioeconomic History  . Marital status: Single    Spouse name: Not on file  . Number of children: Not on file  . Years of education: Not on file  . Highest education level: Not on file  Occupational History  . Not on file  Social Needs  . Financial resource strain: Not on file  . Food insecurity:    Worry: Not on file    Inability: Not on file  . Transportation needs:    Medical: Not on file    Non-medical: Not on file  Tobacco Use  . Smoking status: Never Smoker  . Smokeless tobacco: Never Used  Substance and Sexual Activity  . Alcohol use: No  . Drug use: Not on file  . Sexual activity: Yes    Birth control/protection: I.U.D.  Lifestyle  . Physical activity:    Days per week: Not on file    Minutes per session: Not on file  . Stress: Not on file  Relationships  . Social connections:    Talks on phone: Not on file    Gets together: Not on file    Attends religious service: Not on file    Active member of club or organization: Not on file    Attends meetings of clubs or organizations: Not on file    Relationship status: Not on file  . Intimate partner violence:    Fear of current or ex partner: Not on file    Emotionally abused: Not on file    Physically abused: Not on file    Forced sexual  activity: Not on file  Other Topics Concern  . Not on file  Social History Narrative  . Not on file    Family History  Problem Relation Age of Onset  . Cancer Father     BP 100/66   Pulse 61   Ht 5\' 8"  (1.727 m)   Wt 143 lb (64.9 kg)   BMI 21.74 kg/m   Review of Systems: See HPI above.     Objective:  Physical Exam:  Gen: NAD, comfortable in exam room  Left knee: No deformity. FROM with 5/5 strength. TTP distal IT band over lateral femoral condyle.  No joint line, other tenderness. NVI distally.   Assessment & Plan:  1. Left knee pain - 2/2 distal IT band syndrome.  Injection given today, well tolerated by patient.  Encouraged continue home exercises, stretches, working with trainers.  Tylenol, ibuprofen if needed.  Discussed ice and avoiding common peroneal nerve area.  F/u in 6 weeks with Dr. Margaretha Sheffield.  After informed written consent timeout was performed, patient was seated on exam table.  Area overlying distal IT band prepped with alcohol swab then 2:1 bupivicaine: depomedrol injected between IT band and distal lateral femoral condyle.  Patient tolerated procedure well without immediate  complications.

## 2018-11-08 ENCOUNTER — Ambulatory Visit (INDEPENDENT_AMBULATORY_CARE_PROVIDER_SITE_OTHER): Payer: 59 | Admitting: Sports Medicine

## 2018-11-08 ENCOUNTER — Encounter: Payer: Self-pay | Admitting: Sports Medicine

## 2018-11-08 VITALS — BP 98/50 | Ht 68.0 in | Wt 146.0 lb

## 2018-11-08 DIAGNOSIS — M7631 Iliotibial band syndrome, right leg: Secondary | ICD-10-CM

## 2018-11-08 MED ORDER — TRIAMCINOLONE ACETONIDE 10 MG/ML IJ SUSP
10.0000 mg | Freq: Once | INTRAMUSCULAR | Status: AC
  Administered 2018-11-08: 10 mg via INTRA_ARTICULAR

## 2018-11-08 NOTE — Progress Notes (Signed)
Subjective:    Patient ID: Allison Burns, female    DOB: [birth year], 23 y.o.   MRN: [id number]  HPI 23 year old female who presents for right knee pain. 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 The pain started around 1 month ago while she was walking. She states that the pain is "the same pain as the other knee". 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 She underwent MRI which was consistent with this pathology and ruled out a stress fracture. Patient received a glucocorticoid injection which has greatly improved her symptoms. The pain has been so bad that she has not been running over this time period. She has taken ibuprofen to relieve the pain which has helped a little bit. She has been doing some hip strengthening exercises with a trainer at [facility] which has helped some. The pain is triggered by crossing her legs while sitting.  Patient desires glucocorticoid injection as she wishes to return to running as soon as possible.  Review of Systems Per HPI    Objective:   Physical Exam General: well appearing 23 year old female, no acute distress Pulm: no respiratory distress, no accessory muscle use Psych: pleasant, appropriate  Right Knee Inspection: no focal asymmetry between R/L knee, no swelling noted, no significant pes planus, varus or valgus deformity noted Palpation: Tenderness to palpation lateral medial and inferior knee. More tender to palpation when sitting with legs cross Range of motion: full intact to extension, flexion, hip adduction, hip abduction, flexion, extension Strength: 5/5 strength knee flexion/extension, hip adduction/abduction, hip flexion and extension Stability: no evidence of knee instability Special tests: negative noble's test. Negative thessaly's test. Negative mcmurray test, negative anterior and posterior drawer. Neurovascular: <2 sec cap refill, warm and dry. No focal neuro deficit Gait: No significant  gait abnormality appreciated  Left Knee Inspection: mild area of hypopigmented skin matching with previous IT band injection site Palpation: no tenderness to palpation Range of motion: full intact to extension, flexion, hip adduction, hip abduction, flexion, extension Strength: 5/5 strength knee flexion/extension, hip adduction/abduction, hip flexion and extension Stability: no evidence of knee instability Special tests: negative noble's test. Negative thessaly's test. Negative mcmurray test, negative anterior and posterior drawer. Neurovascular: <2 sec cap refill, warm and dry. 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 Painful distribution and history consistent with IT band syndrome. Given patient's desire to return to running quickly gave glucocorticoid shot. Patient to continue doing the hip abductor exercises. Ok to return to practice in 1-2 weeks following injections. Follow up as needed.  Right Knee pain - s/p IT band injection - icing, discussed avoiding common peroneal nerve - continue home exercises and stretches - f/u as needed  Procedure note for right IT band injection After informed written consent timeout was performed, patient was laid supine on table with one pillow under right leg. Area overlying distal IT band prepped with alcohol swab x3 then 1:1 bupivicaine: kenalog injected between IT band and distal lateral femoral condyle.  Patient tolerated procedure well without immediate complications  Myrene Buddy MD PGY-2 Family Medicine Resident  Patient seen and evaluated with the resident.  I agree with the above plan of care.  Distal IT band of the right knee was injected after risks and benefits were explained to the patient including  risk of hypopigmentation and IT band rupture.  Of note, she did develop a small area of hypopigmentation  in the left knee after recent injection.  For that reason, I have elected to try triamcinolone instead of Depo-Medrol.  No running for the next 7 days.  If symptoms persist consider imaging at that time.  Follow-up for ongoing or recalcitrant issues.

## 2018-11-09 ENCOUNTER — Encounter: Payer: Self-pay | Admitting: Sports Medicine

## 2023-04-23 ENCOUNTER — Encounter: Payer: 59 | Admitting: Internal Medicine

## 2023-04-23 DIAGNOSIS — G4719 Other hypersomnia: Secondary | ICD-10-CM

## 2023-04-28 NOTE — Procedures (Signed)
[facility]  Polysomnogram Report Part I                                                               Phone: [phone] Fax: [phone]  Patient Name: Allison Burns, Allison Burns. Acquisition Number: [id number]  Date of Birth: [birth year] Acquisition Date: 04/23/2023  Referring Physician: Gerilyn Pilgrim T. [place],MD     History: The patient is a 27 year old  who was referred for evaluation of . Medical History: TBI.  Medications: rizatriplan.  Procedure: This routine overnight polysomnogram was performed on the [facility] 5 using the standard diagnostic protocol. This included 6 channels of EEG, 2 channels of EOG, chin EMG, bilateral anterior tibialis EMG, nasal/oral thermistor, PTAF (nasal pressure transducer), chest and abdominal wall movements, EKG, and pulse oximetry.  Description: The total recording time was 466.4 minutes. The total sleep time was 381.5 minutes. There were a total of 25.4 minutes of wakefulness after sleep onset for a reducedsleep efficiency of 81.8%. The latency to sleep onset was prolonged at 59.5 minutes. The R sleep onset latency was prolonged at 173.0 minutes. Sleep parameters, as a percentage of the total sleep time, demonstrated 2.4% of sleep was in N1 sleep, 51.5% N2, 26.6% N3 and 19.5% R sleep. There were a total of 36 arousals for an arousal index of 5.7 arousals per hour of sleep that was normal.  Respiratory monitoring demonstrated  snoring . Only one respiratory event was observed. The baseline oxygen saturation during wakefulness was 97%, during NREM sleep averaged 96%, and during REM sleep averaged 97%. The total duration of oxygen < 90% was 0.0 minutes.  Cardiac monitoring-  significant cardiac rhythm irregularities.   Periodic limb movement monitoring- demonstrated that there were 6 periodic limb movements for a periodic limb movement index of 0.9 periodic limb movements per hour of sleep. Quasi-periodic limb movements were observed prior to sleep  onset.  Impression: This routine overnight polysomnogram did not demonstrate significant obstructive sleep apnea with only one respiratory event observed.     Quasi-periodic limb movements were observed prior to sleep onset. Clinical correlation is suggested.   reduced sleep efficiency with a prolonged sleep latency. Sleep progressed normally through all stages. The patient reports a history of disrupted sleep.  Recommendations:     Sleep hygiene recommendations should be reviewed. The patient may benefit from cognitive behavioral therapy for insomnia. This can be done with a mental health professional or via web-based programs.    Yevonne Pax, MD, [facility] Diplomate ABMS-Pulmonary, Critical Care and Sleep Medicine  Electronically reviewed and digitally signed  [facility] Polysomnogram Report Part II  Phone: [phone] Fax: [phone]  Patient last name Malmquist Neck Size 14.0 in. Acquisition [id number]  Patient first name Allison A. Weight 146.0 lbs. Started 04/23/2023 at 10:07:35 PM  Birth date Sep 12, 1995 Height 68.0 in. Stopped 04/24/2023 at 6:01:47 AM  Age 36 BMI 22.2 lb/in2 Duration 466.4  Study Type Adult      Tech Neldon Mc RPSGT  Reviewed by: Valentino Hue. Henke, PhD, ABSM, FAASM Sleep Data: Lights Out: 10:13:05 PM Sleep Onset: 11:12:35 PM  Lights On: 5:59:29 AM Sleep Efficiency: 81.8 %  Total Recording Time: 466.4 min Sleep Latency (from Lights Off) 59.5 min  Total Sleep Time (TST): 381.5 min  R Latency (from Sleep Onset): 173.0 min  Sleep Period Time: 399.0 min Total number of awakenings: 14  Wake during sleep: 17.5 min Wake After Sleep Onset (WASO): 25.4 min   Sleep Data:         Arousal Summary: Stage  Latency from lights out (min) Latency from sleep onset (min) Duration (min) % Total Sleep Time  Normal values  N 1 59.5 0.0 9.0 2.4 (5%)  N 2 60.5 1.0 196.5 51.5 (50%)  N 3 79.5 20.0 101.5 26.6 (20%)  R 232.5 173.0 74.5 19.5 (25%)   Number Index   Spontaneous 35 5.5  Apneas & Hypopneas 0 0.0  RERAs 0 0.0       (Apneas & Hypopneas & RERAs)  (0) (0.0)  Limb Movement 1 0.2  Snore 0 0.0  TOTAL 36 5.7     Respiratory Data:  CA OA MA Apnea Hypopnea* A+ H RERA Total  Number 0 1 0 1 0 1 0 1  Mean Dur (sec) 0.0 14.5 0.0 14.5 0.0 14.5 0.0 14.5  Max Dur (sec) 0.0 14.5 0.0 14.5 0.0 14.5 0.0 14.5  Total Dur (min) 0.0 0.2 0.0 0.2 0.0 0.2 0.0 0.2  % of TST 0.0 0.1 0.0 0.1 0.0 0.1 0.0 0.1  Index (#/h TST) 0.0 0.2 0.0 0.2 0.0 0.2 0.0 0.2  *Hypopneas scored based on 4% or greater desaturation.  Sleep Stage:        REM NREM TST  AHI 0.8 0.0 0.2  RDI 0.8 0.0 0.2           Body Position Data:  Sleep (min) TST (%) REM (min) NREM (min) CA (#) OA (#) MA (#) HYP (#) AHI (#/h) RERA (#) RDI (#/h) Desat (#)  Supine 119.6 31.35 29.5 90.1 0 0 0 0 0.0 0 0.00 1  Non-Supine 261.90 68.65 45.00 216.90 0.00 1.00 0.00 0.00 0.23 0 0.23 1.00  Left: 148.3 38.87 45.0 103.3 0 1 0 0 0.4 0 0.4 1  Right: 103.0 27.00 0.0 103.0 0 0 0 0 0.0 0 0.00 0  UP: 10.6 2.78 0.0 10.6 0 0 0 0 0.0 0 0.00 0     Snoring: Total number of snoring episodes  0  Total time with snoring    min (   % of sleep)   Oximetry Distribution:             WK REM NREM TOTAL  Average (%)   97 97 96 97  < 90% 0.0 0.0 0.0 0.0  < 80% 0.0 0.0 0.0 0.0  < 70% 0.0 0.0 0.0 0.0  # of Desaturations* 0 1 1 2   Desat Index (#/hour) 0.0 0.8 0.2 0.3  Desat Max (%) 0 4 3 4   Desat Max Dur (sec) 0.0 76.0 20.0 76.0  Approx Min O2 during sleep 94  Approx min O2 during a respiratory event 96  Was Oxygen added (Y/N) and final rate :    LPM  *Desaturations based on 3% or greater drop from baseline.   Cheyne Stokes Breathing: None Present   Heart Rate Summary:  Average Heart Rate During Sleep 63.7 bpm      Highest Heart Rate During Sleep (95th %) 71.0 bpm      Highest Heart Rate During Sleep 183 bpm (artifact)  Highest Heart Rate During Recording (TIB) 214 bpm (artifact)   Heart  Rate Observations: Event Type # Events   Bradycardia 0 Lowest HR Scored: N/A  Sinus Tachycardia During Sleep 0 Highest  HR Scored: N/A  Narrow Complex Tachycardia 0 Highest HR Scored: N/A  Wide Complex Tachycardia 0 Highest HR Scored: N/A  Asystole 0 Longest Pause: N/A  Atrial Fibrillation 0 Duration Longest Event: N/A  Other Arrythmias   Type:    Periodic Limb Movement Data: (Primary legs unless otherwise noted) Total # Limb Movement 8 Limb Movement Index 1.3  Total # PLMS 6 PLMS Index 0.9  Total # PLMS Arousals    PLMS Arousal Index     Percentage Sleep Time with PLMS 4.35min (1.1 % sleep)  Mean Duration limb movements (secs) 261.0
